# Patient Record
Sex: Male | Born: 1955 | Race: White | Hispanic: No | Marital: Married | Smoking: Former smoker
Health system: Southern US, Community
[De-identification: ages and names within clinical notes are randomized; demographics above are authoritative.]

## PROBLEM LIST (undated history)

## (undated) DIAGNOSIS — I10 Essential (primary) hypertension: Secondary | ICD-10-CM

## (undated) HISTORY — PX: CHOLECYSTECTOMY: SHX55

---

## 2014-10-12 ENCOUNTER — Emergency Department (INDEPENDENT_AMBULATORY_CARE_PROVIDER_SITE_OTHER)

## 2014-10-12 ENCOUNTER — Emergency Department (INDEPENDENT_AMBULATORY_CARE_PROVIDER_SITE_OTHER)
Admission: EM | Admit: 2014-10-12 | Discharge: 2014-10-12 | Disposition: A | Discharge status: Home / Self Care | Source: Home / Self Care | Attending: Family Medicine | Admitting: Family Medicine

## 2014-10-12 DIAGNOSIS — R05 Cough: Secondary | ICD-10-CM

## 2014-10-12 DIAGNOSIS — R0981 Nasal congestion: Secondary | ICD-10-CM

## 2014-10-12 DIAGNOSIS — R059 Cough, unspecified: Secondary | ICD-10-CM

## 2014-10-12 MED ORDER — AZITHROMYCIN 250 MG PO TABS: 250.0000 mg | ORAL_TABLET | Freq: Every day | ORAL | Status: DC

## 2014-10-12 MED ORDER — PREDNISONE 10 MG PO TABS: 30.0000 mg | ORAL_TABLET | Freq: Every day | ORAL | Status: DC

## 2014-10-12 MED ORDER — IPRATROPIUM-ALBUTEROL 0.5-2.5 (3) MG/3ML IN SOLN
3.0000 mL | Freq: Once | RESPIRATORY_TRACT | Status: AC
  Administered 2014-10-12: 3 mL via RESPIRATORY_TRACT

## 2014-10-12 MED ORDER — IPRATROPIUM BROMIDE 0.06 % NA SOLN: 2.0000 | Freq: Four times a day (QID) | NASAL | Status: DC

## 2014-10-12 NOTE — ED Notes (Signed)
Reports 5 day history of progressively worse congestion, cough, headache, wheezing, hoarseness and fatigue; denies fever. Robitussin this a.m.

## 2014-10-12 NOTE — ED Provider Notes (Signed)
Juan Rios is a 59 y.o. male who presents to Urgent Care today for nasal congestion cough and shortness of breath. Patient has a five-day history of sinus congestion and runny nose. The discharge was clear until yesterday when he became yellowish. The cough is productive of sputum. He is developed wheezing and shortness of breath also. No vomiting diarrhea or chest pain. He's tried multiple over-the-counter medications which have not helped much.   No past medical history on file. No past surgical history on file. History  Substance Use Topics  . Smoking status: Not on file  . Smokeless tobacco: Not on file  . Alcohol Use: Not on file   ROS as above Medications: No current facility-administered medications for this encounter.   Current Outpatient Prescriptions  Medication Sig Dispense Refill  . fluticasone (FLONASE) 50 MCG/ACT nasal spray Place into both nostrils daily.    . hydrochlorothiazide (HYDRODIURIL) 25 MG tablet Take 25 mg by mouth daily.    Marland Kitchen. azithromycin (ZITHROMAX) 250 MG tablet Take 1 tablet (250 mg total) by mouth daily. Take first 2 tablets together, then 1 every day until finished. 6 tablet 0  . ipratropium (ATROVENT) 0.06 % nasal spray Place 2 sprays into both nostrils 4 (four) times daily. 15 mL 1  . predniSONE (DELTASONE) 10 MG tablet Take 3 tablets (30 mg total) by mouth daily. 15 tablet 0   No Known Allergies   Exam:  BP 142/90 mmHg  Pulse 107  Temp(Src) 98.2 F (36.8 C) (Oral)  Resp 20  Ht 5\' 8"  (1.727 m)  Wt 300 lb (136.079 kg)  BMI 45.63 kg/m2  SpO2 94% Gen: Well NAD HEENT: EOMI,  MMM difficult to see posterior pharynx mildly erythematous. Normal tympanic membranes and right partially occluded by cerumen on left.  Lungs: Normal work of breathing.Slight coarse breath sounds right side Heart: RRR no MRG Abd: NABS, Soft. Nondistended, Nontender Exts: Brisk capillary refill, warm and well perfused.   Patient was given a 2.5/0.5 mg DuoNeb nebulizer  treatment, and  Felt a little better  No results found for this or any previous visit (from the past 24 hour(s)). Dg Chest 2 View  10/12/2014   CLINICAL DATA:  Two day history of cough  EXAM: CHEST  2 VIEW  COMPARISON:  None.  FINDINGS: There is no edema or consolidation. The heart size and pulmonary vascularity are normal. No adenopathy. No bone lesions. There is evidence of an old healed fracture of the left clavicle.  IMPRESSION: No edema or consolidation.   Electronically Signed   By: Bretta BangWilliam  Woodruff M.D.   On: 10/12/2014 13:53    Assessment and Plan: 59 y.o. male with  Sinusitis versus bronchitis. Treatment with prednisone and Atrovent nasal spray and azithromycin if not better. The x-ray took over 2 hours to do and get results back. Patient left prior to x-ray results back. I  Apologized for the delay.   Discussed warning signs or symptoms. Please see discharge instructions. Patient expresses understanding.     Rodolph BongEvan S Dashae Wilcher, MD 10/12/14 [phone]

## 2014-10-12 NOTE — Discharge Instructions (Signed)
Thank you for coming in today. I am sorry for the delay today.  I will call you if anything comes back.  Take the prednisone and nasal spray.  Take antibiotics if not improving.  Call or go to the emergency room if you get worse, have trouble breathing, have chest pains, or palpitations.   Sinusitis Sinusitis is redness, soreness, and inflammation of the paranasal sinuses. Paranasal sinuses are air pockets within the bones of your face (beneath the eyes, the middle of the forehead, or above the eyes). In healthy paranasal sinuses, mucus is able to drain out, and air is able to circulate through them by way of your nose. However, when your paranasal sinuses are inflamed, mucus and air can become trapped. This can allow bacteria and other germs to grow and cause infection. Sinusitis can develop quickly and last only a short time (acute) or continue over a long period (chronic). Sinusitis that lasts for more than 12 weeks is considered chronic.  CAUSES  Causes of sinusitis include:  Allergies.  Structural abnormalities, such as displacement of the cartilage that separates your nostrils (deviated septum), which can decrease the air flow through your nose and sinuses and affect sinus drainage.  Functional abnormalities, such as when the small hairs (cilia) that line your sinuses and help remove mucus do not work properly or are not present. SIGNS AND SYMPTOMS  Symptoms of acute and chronic sinusitis are the same. The primary symptoms are pain and pressure around the affected sinuses. Other symptoms include:  Upper toothache.  Earache.  Headache.  Bad breath.  Decreased sense of smell and taste.  A cough, which worsens when you are lying flat.  Fatigue.  Fever.  Thick drainage from your nose, which often is green and may contain pus (purulent).  Swelling and warmth over the affected sinuses. DIAGNOSIS  Your health care provider will perform a physical exam. During the exam, your  health care provider may:  Look in your nose for signs of abnormal growths in your nostrils (nasal polyps).  Tap over the affected sinus to check for signs of infection.  View the inside of your sinuses (endoscopy) using an imaging device that has a light attached (endoscope). If your health care provider suspects that you have chronic sinusitis, one or more of the following tests may be recommended:  Allergy tests.  Nasal culture. A sample of mucus is taken from your nose, sent to a lab, and screened for bacteria.  Nasal cytology. A sample of mucus is taken from your nose and examined by your health care provider to determine if your sinusitis is related to an allergy. TREATMENT  Most cases of acute sinusitis are related to a viral infection and will resolve on their own within 10 days. Sometimes medicines are prescribed to help relieve symptoms (pain medicine, decongestants, nasal steroid sprays, or saline sprays).  However, for sinusitis related to a bacterial infection, your health care provider will prescribe antibiotic medicines. These are medicines that will help kill the bacteria causing the infection.  Rarely, sinusitis is caused by a fungal infection. In theses cases, your health care provider will prescribe antifungal medicine. For some cases of chronic sinusitis, surgery is needed. Generally, these are cases in which sinusitis recurs more than 3 times per year, despite other treatments. HOME CARE INSTRUCTIONS   Drink plenty of water. Water helps thin the mucus so your sinuses can drain more easily.  Use a humidifier.  Inhale steam 3 to 4 times a day (  for example, sit in the bathroom with the shower running).  Apply a warm, moist washcloth to your face 3 to 4 times a day, or as directed by your health care provider.  Use saline nasal sprays to help moisten and clean your sinuses.  Take medicines only as directed by your health care provider.  If you were prescribed either  an antibiotic or antifungal medicine, finish it all even if you start to feel better. SEEK IMMEDIATE MEDICAL CARE IF:  You have increasing pain or severe headaches.  You have nausea, vomiting, or drowsiness.  You have swelling around your face.  You have vision problems.  You have a stiff neck.  You have difficulty breathing. MAKE SURE YOU:   Understand these instructions.  Will watch your condition.  Will get help right away if you are not doing well or get worse. Document Released: 09/19/2005 Document Revised: 02/03/2014 Document Reviewed: 10/04/2011 [facility] Patient Information 2015 [employer], [place]. This information is not intended to replace advice given to you by your health care provider. Make sure you discuss any questions you have with your health care provider.

## 2014-10-18 ENCOUNTER — Encounter: Payer: Self-pay | Admitting: Emergency Medicine

## 2015-07-26 IMAGING — CR DG CHEST 2V
2 series · 2 of 2 positions shown · non-contrast
Comparison: None.

CLINICAL DATA: Two day history of cough

EXAM:
CHEST  2 VIEW

[view not recorded (1 of 2)]
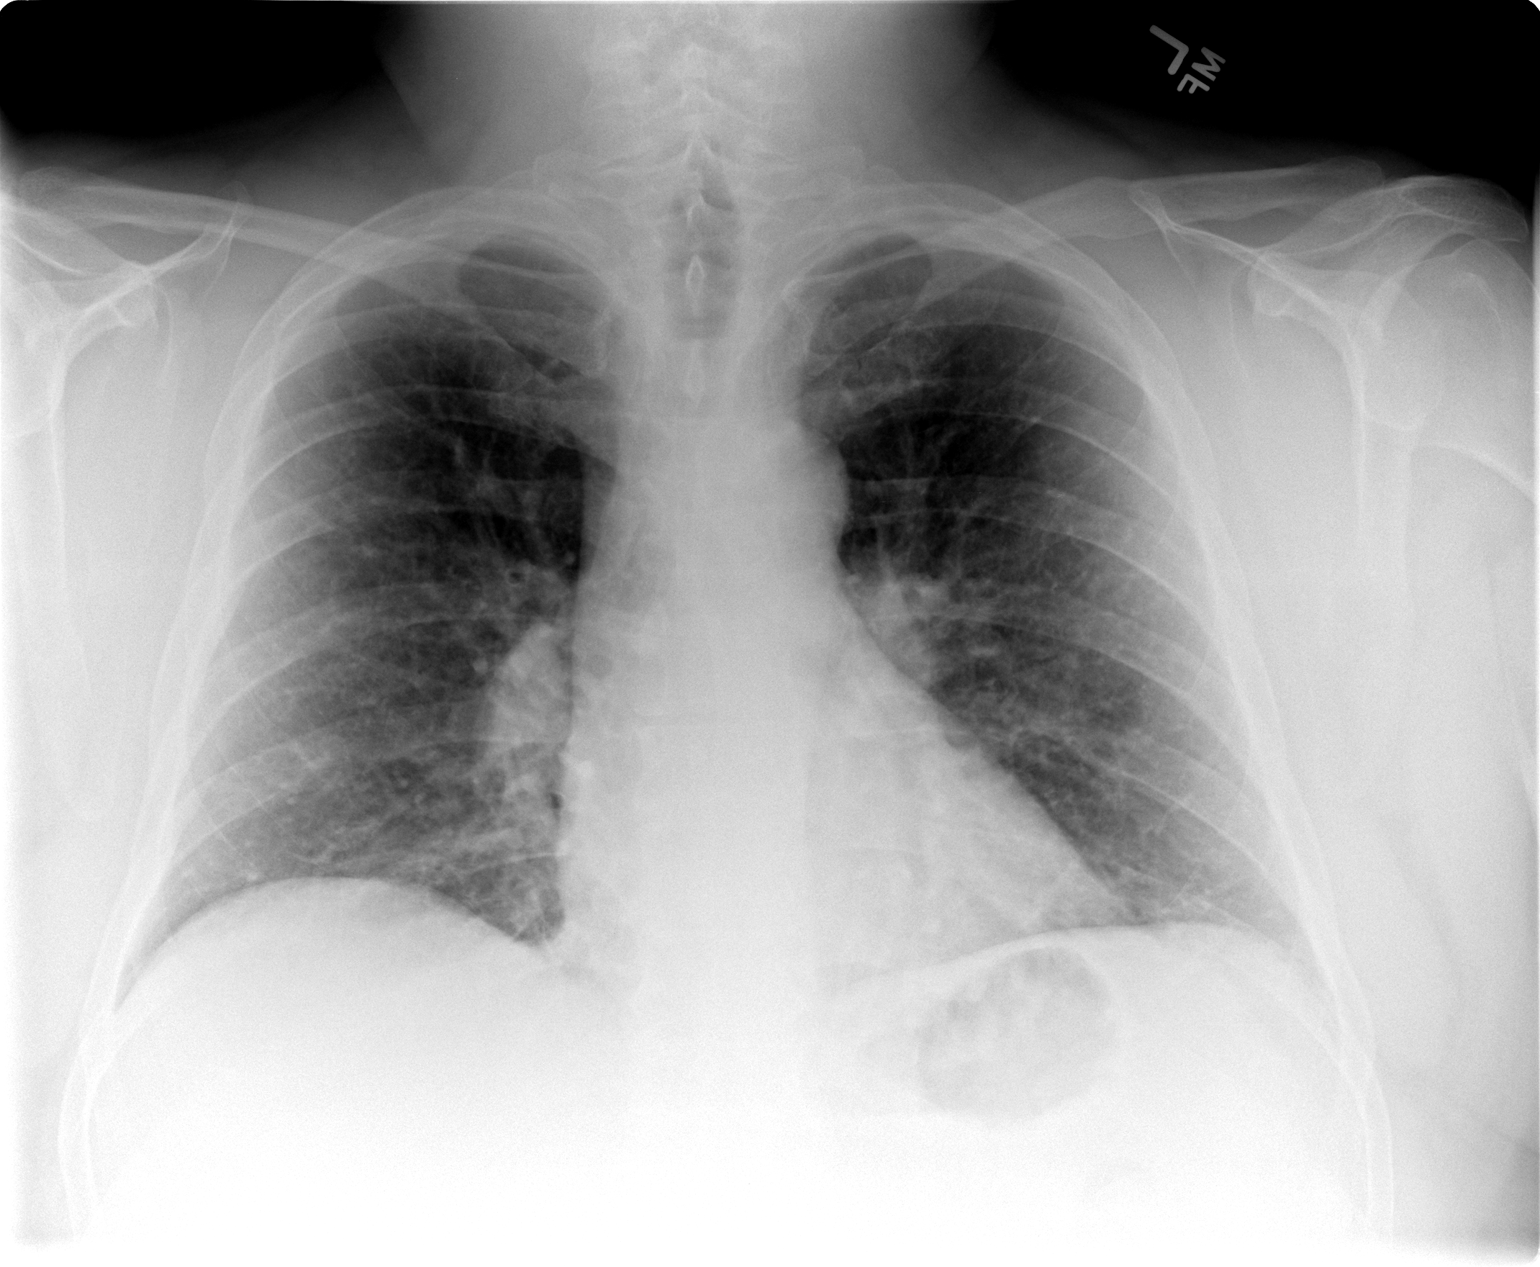

[view not recorded (2 of 2)]
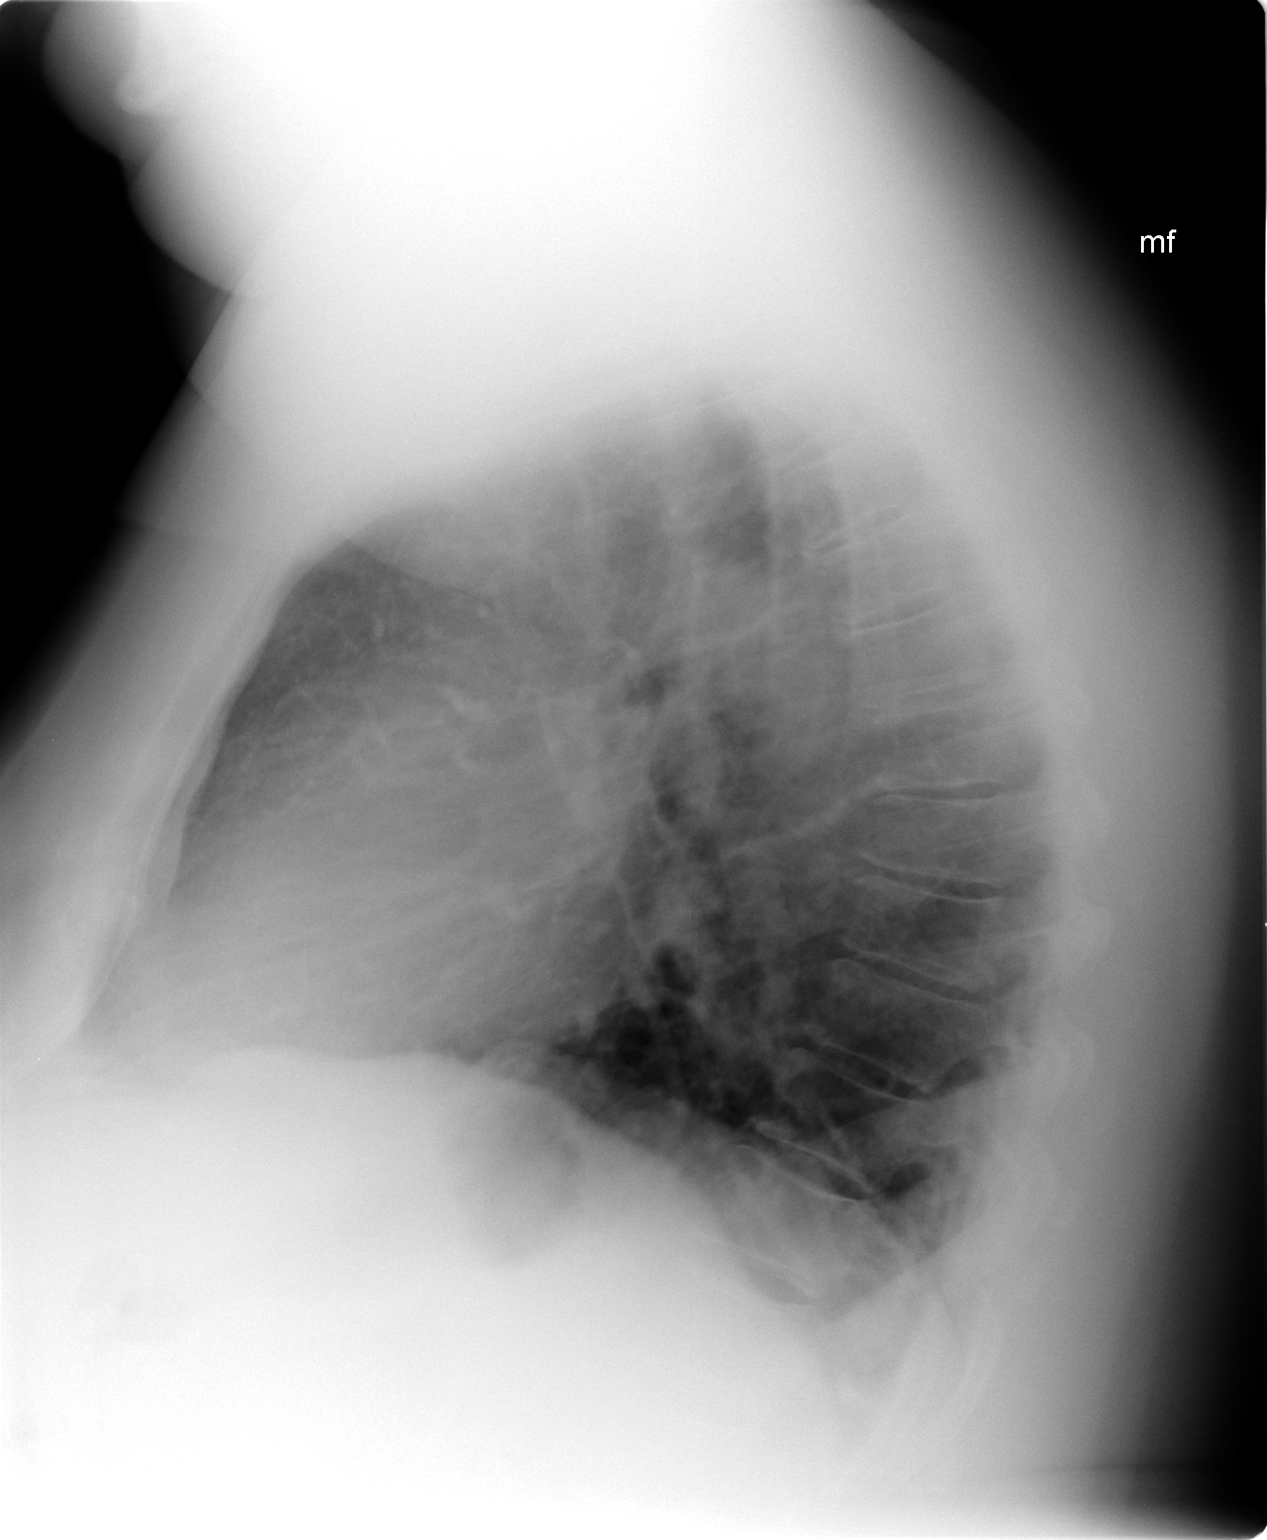

[2 of 2 positions shown; findings below may reference images not displayed]

FINDINGS: There is no edema or consolidation. The heart size and pulmonary
vascularity are normal. No adenopathy. No bone lesions. There is
evidence of an old healed fracture of the left clavicle.
IMPRESSION: No edema or consolidation.

## 2016-03-05 ENCOUNTER — Emergency Department (INDEPENDENT_AMBULATORY_CARE_PROVIDER_SITE_OTHER)

## 2016-03-05 ENCOUNTER — Encounter: Payer: Self-pay | Admitting: Emergency Medicine

## 2016-03-05 ENCOUNTER — Emergency Department (INDEPENDENT_AMBULATORY_CARE_PROVIDER_SITE_OTHER)
Admission: EM | Admit: 2016-03-05 | Discharge: 2016-03-05 | Disposition: A | Discharge status: Home / Self Care | Source: Home / Self Care | Attending: Family Medicine | Admitting: Family Medicine

## 2016-03-05 DIAGNOSIS — M79641 Pain in right hand: Secondary | ICD-10-CM

## 2016-03-05 DIAGNOSIS — S63501A Unspecified sprain of right wrist, initial encounter: Secondary | ICD-10-CM | POA: Diagnosis not present

## 2016-03-05 DIAGNOSIS — M25531 Pain in right wrist: Secondary | ICD-10-CM

## 2016-03-05 DIAGNOSIS — S6391XA Sprain of unspecified part of right wrist and hand, initial encounter: Secondary | ICD-10-CM | POA: Diagnosis not present

## 2016-03-05 HISTORY — DX: Essential (primary) hypertension: I10

## 2016-03-05 NOTE — ED Notes (Signed)
Pt fell on his right hand/wrist on yesterday.  Pt is having pain and swelling to the wrist.

## 2016-03-05 NOTE — Discharge Instructions (Signed)
You may alternate acetaminophen (Tylenol) and ibuprofen (Motrin or Advil) for pain and swelling. You should try to wear the wrist splint for about 1-2 weeks, and may remove while showering.  If pain continues, you may need repeat imaging or even a CT scan that can pick up on occult/hairline fractures that may be difficult to see at time of initial injury.  See below for further instructions.

## 2016-03-05 NOTE — ED Provider Notes (Signed)
CSN: [id number]     Arrival date & time 03/05/16  1033 History   First MD Initiated Contact with Patient 03/05/16 1048     Chief Complaint  Patient presents with  . Hand Injury   (Consider location/radiation/quality/duration/timing/severity/associated sxs/prior Treatment) HPI  The pt is a 60yo male [facility] with c/o Right hand and wrist pain after tripping and nearly falling yesterday, catching himself by putting out his Right hand to grab hold of a brick wall. Pain is aching and sore, only with movement, moderate in severity with movement, no pain at rest. Associated limited ROM and mild to moderate swelling. He has not taken anything for pain today.  Denies any other injuries. Hx of carpal tunnel in his Right wrist but has never had surgery on his wrist.   Past Medical History  Diagnosis Date  . Hypertension    Past Surgical History  Procedure Laterality Date  . Cholecystectomy     No family history on file. Social History  Substance Use Topics  . Smoking status: Never Smoker   . Smokeless tobacco: None  . Alcohol Use: None    Review of Systems  Musculoskeletal: Positive for myalgias, joint swelling and arthralgias.       Right hand and wrist  Skin: Negative for color change and wound.  Neurological: Positive for weakness (  right hand and wrist due to pain) and numbness ( mild intermittent- chronic per pt due to carpal tunnel syndrome).    Allergies  Review of patient's allergies indicates no known allergies.  Home Medications   Prior to Admission medications   Medication Sig Start Date End Date Taking? Authorizing Provider  hydrochlorothiazide (HYDRODIURIL) 25 MG tablet Take 25 mg by mouth daily.    Historical Provider, MD   Meds Ordered and Administered this Visit  Medications - No data to display  BP 135/89 mmHg  Pulse 79  Temp(Src) 98.2 F (36.8 C) (Oral)  Ht 5\' 7"  (1.702 m)  Wt 293 lb (132.904 kg)  BMI 45.88 kg/m2  SpO2 97% No data  found.   Physical Exam  Constitutional: He is oriented to person, place, and time. He appears well-developed and well-nourished.  HENT:  Head: Normocephalic and atraumatic.  Eyes: EOM are normal.  Neck: Normal range of motion.  Cardiovascular: Normal rate.   Right hand: cap refill < 3 seconds  Pulmonary/Chest: Effort normal.  Musculoskeletal: He exhibits edema and tenderness.  Right hand and wrist- mild to moderate edema, limited flexion and extension due to pain. 4/5 grip strength compared to Left. Full ROM all fingers. No snuff-box tenderness. Full ROM elbow, non-tender.  Neurological: He is alert and oriented to person, place, and time.  Right hand- normal sensation compared to Left hand.  Skin: Skin is warm and dry.  Right hand and wrist: skin in tact, no ecchymosis or erythema.  Psychiatric: He has a normal mood and affect. His behavior is normal.  Nursing note and vitals reviewed.   ED Course  Procedures (including critical care time)  Labs Review Labs Reviewed - No data to display  Imaging Review Dg Hand Complete Right  03/05/2016  CLINICAL DATA:  Fall yesterday, landing on right hand. Right hand pain and swelling. EXAM: RIGHT HAND - COMPLETE 3+ VIEW COMPARISON:  None. FINDINGS: No acute bony abnormality. Specifically, no fracture, subluxation, or dislocation. Soft tissues are intact. IMPRESSION: No acute bony abnormality. Electronically Signed   By: Charlett NoseKevin  Dover M.D.   On: 03/05/2016 11:31  MDM   1. Hand sprain, right, initial encounter   2. Right hand pain   3. Right wrist pain   4. Right wrist sprain, initial encounter    Pt c/o Right hand and wrist pain. Limited ROM. Sensation and circulation in tact. Skin in tact. No other injuries.  Plain films reviewed, location of wrist pain visible on plain films of hand, no evidence of fracture or dislocation. Reviewed imaging with pt. Will treat as sprain with thumb spica splint. Encouraged elevation, ice, may  alternate acetaminophen and ibuprofen. F/u with Sports Medicine in 1-2 weeks if not improving, may need repeat imaging for possible occult fracture. Patient verbalized understanding and agreement with treatment plan.     Junius Finner, PA-C 03/05/16 1158

## 2016-12-17 IMAGING — DX DG HAND COMPLETE 3+V*R*
3 series · 3 of 3 positions shown · non-contrast
Comparison: None.

CLINICAL DATA: Fall yesterday, landing on right hand. Right hand
pain and swelling.

EXAM:
RIGHT HAND - COMPLETE 3+ VIEW

[hand pa]
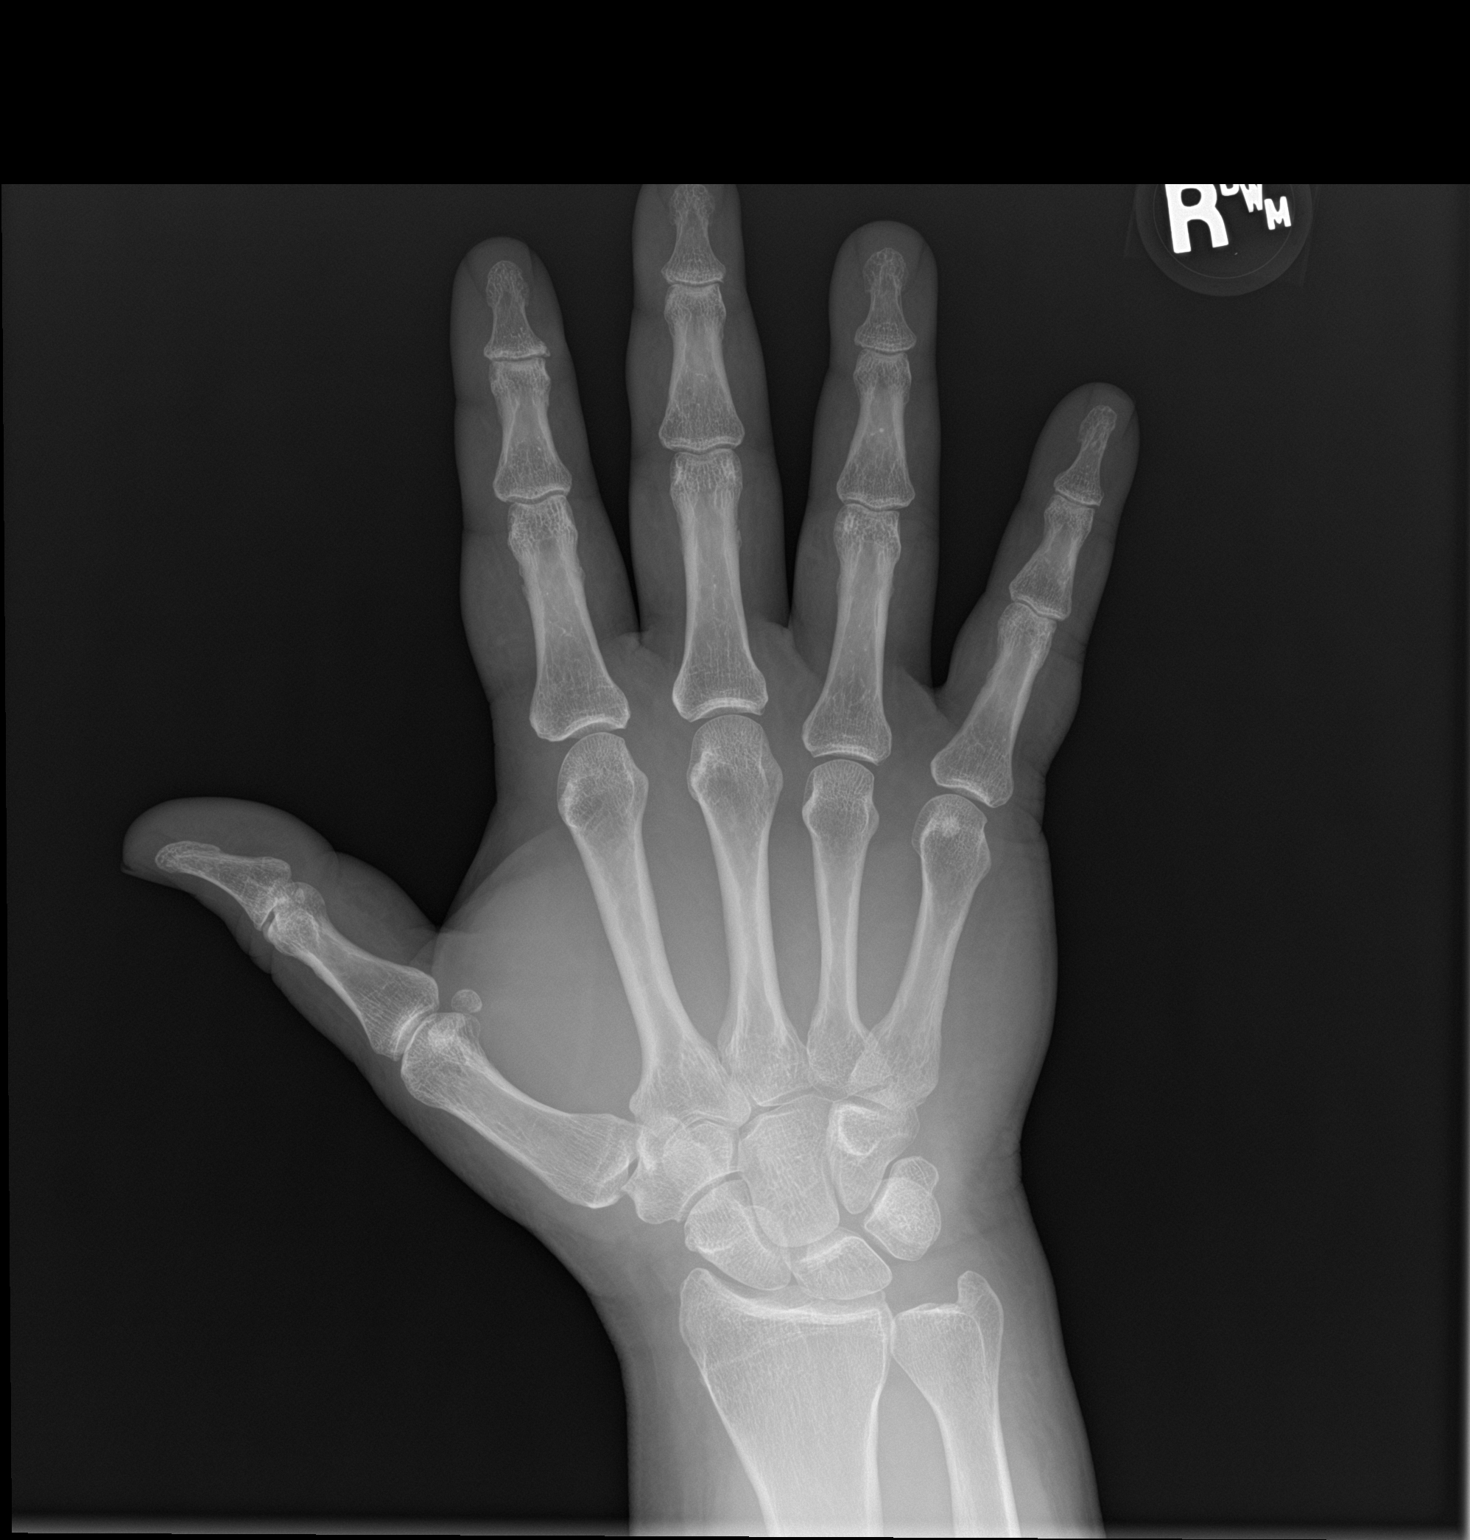

[hand obl]
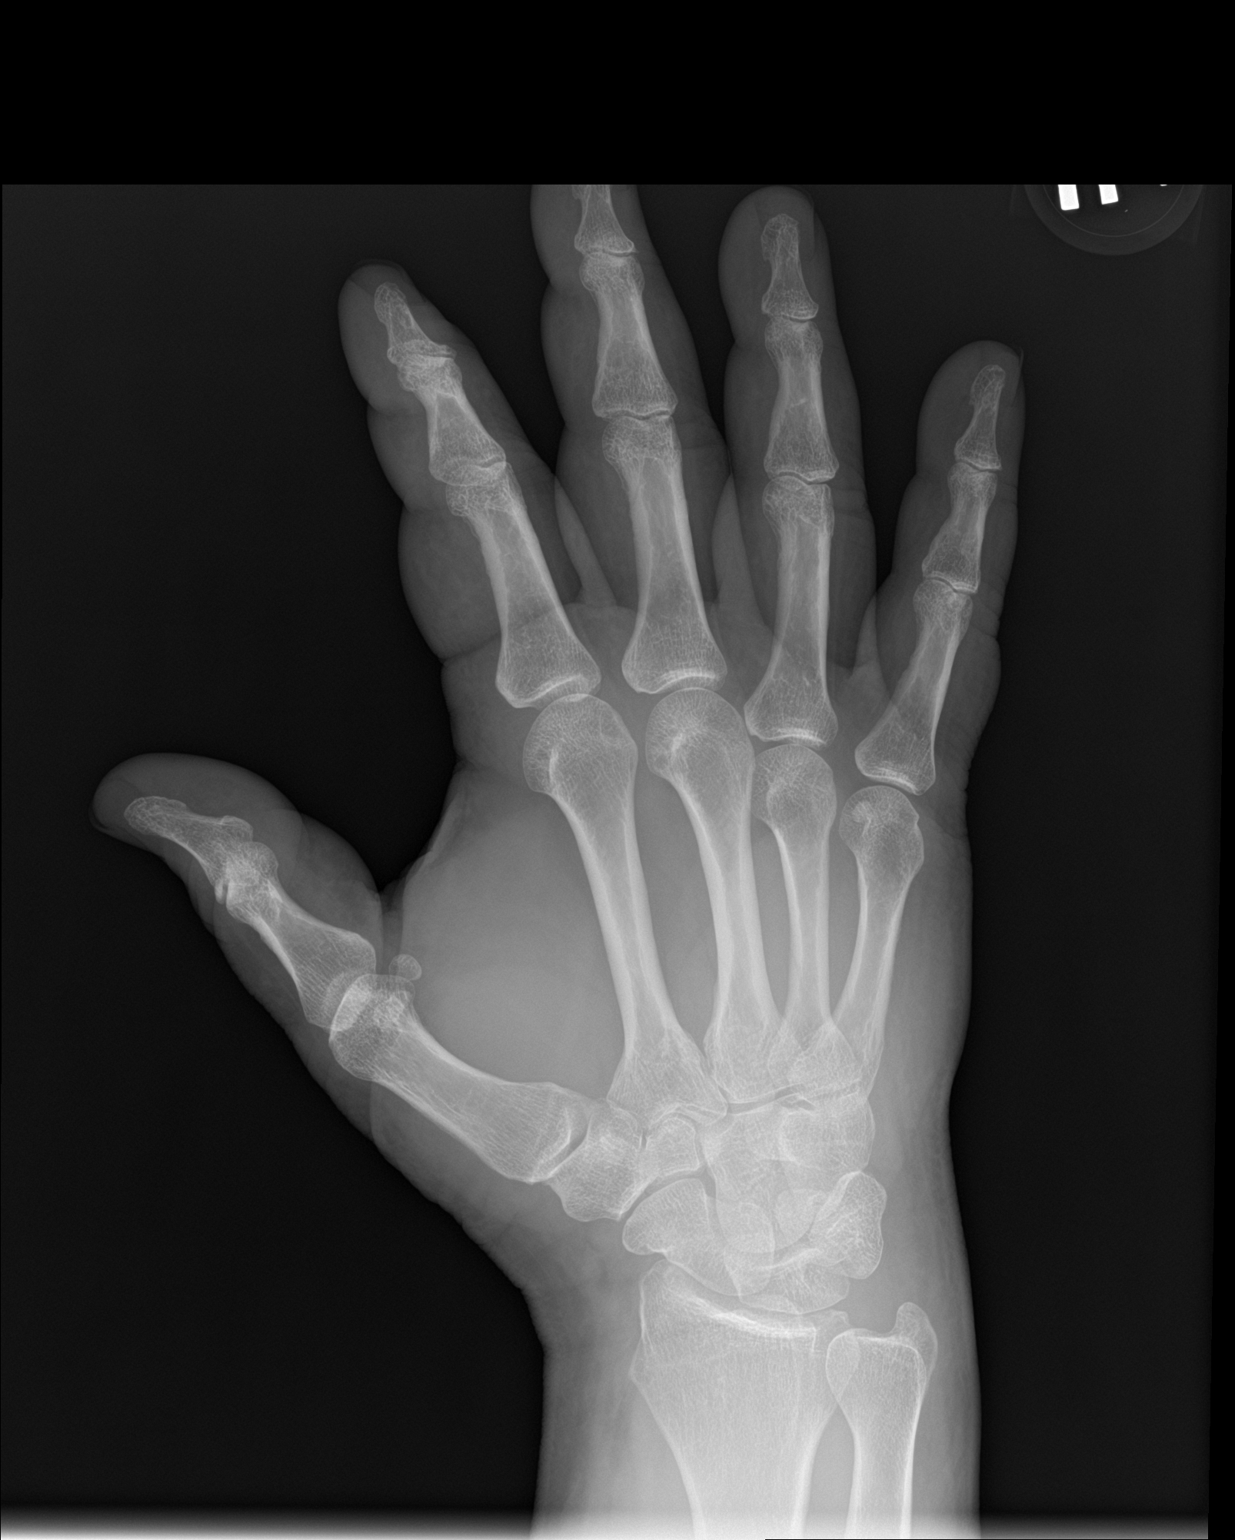

[hand lat]
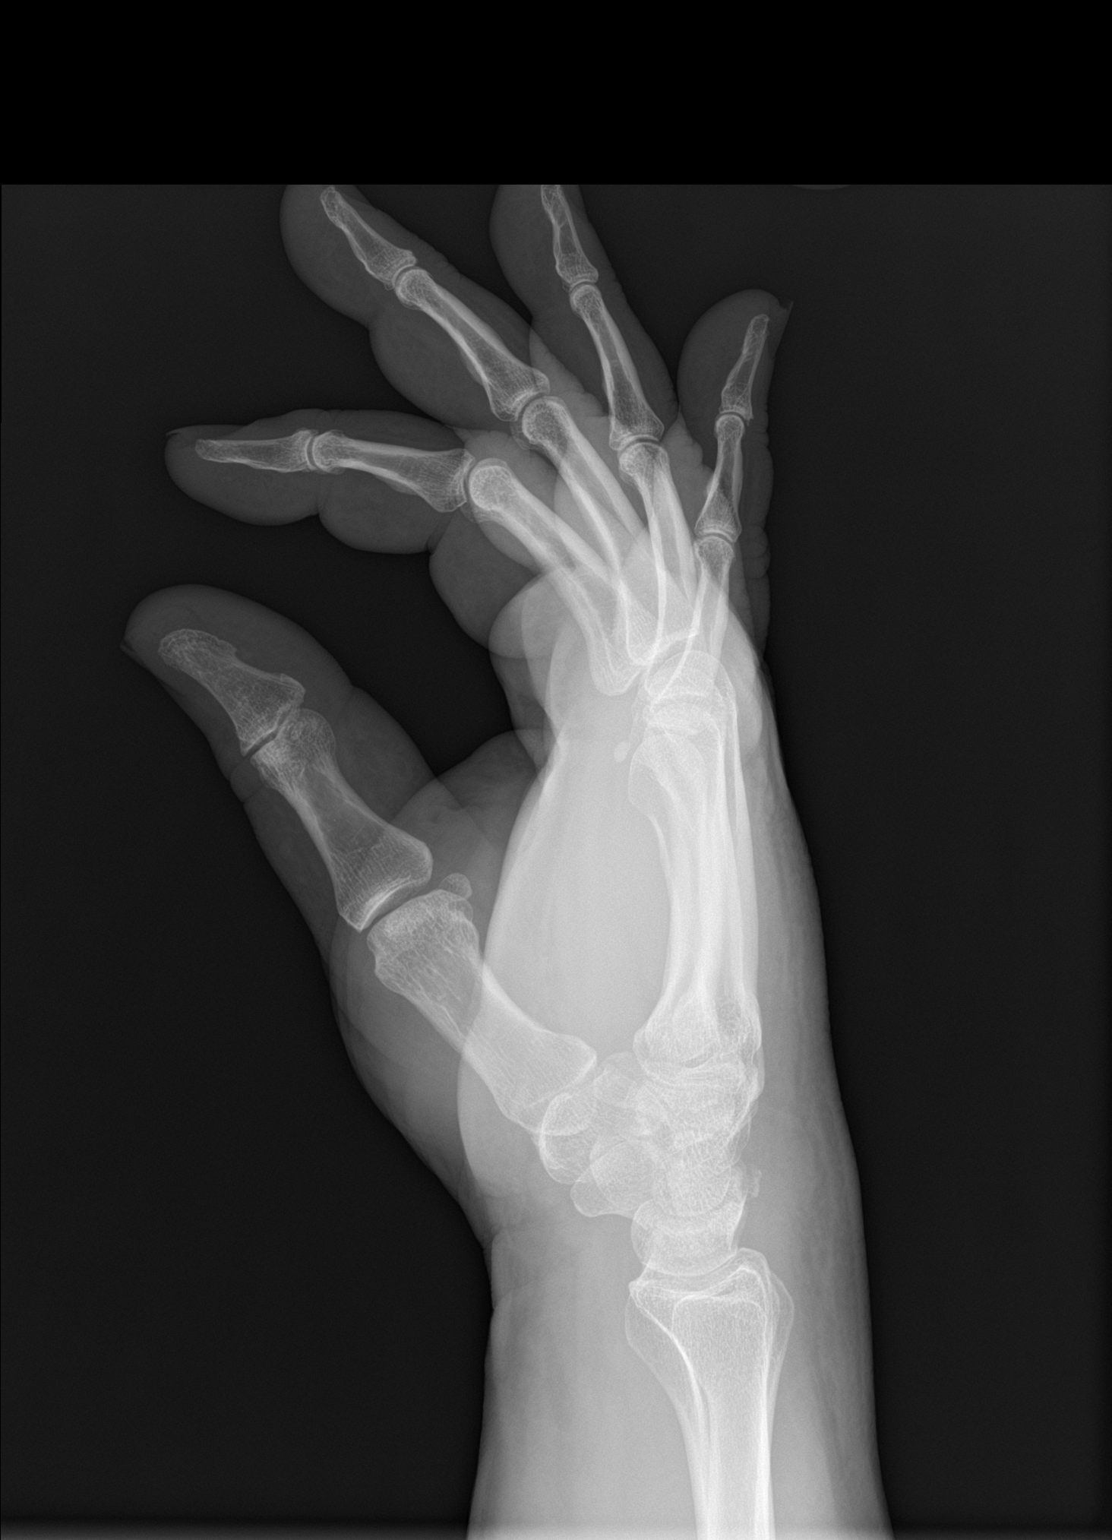

[3 of 3 positions shown; findings below may reference images not displayed]

FINDINGS: No acute bony abnormality. Specifically, no fracture, subluxation,
or dislocation. Soft tissues are intact.
IMPRESSION: No acute bony abnormality.

## 2018-11-03 ENCOUNTER — Encounter: Payer: Self-pay | Admitting: Emergency Medicine

## 2018-11-03 ENCOUNTER — Other Ambulatory Visit: Payer: Self-pay

## 2018-11-03 ENCOUNTER — Emergency Department (INDEPENDENT_AMBULATORY_CARE_PROVIDER_SITE_OTHER)
Admission: EM | Admit: 2018-11-03 | Discharge: 2018-11-03 | Disposition: A | Discharge status: Home / Self Care | Source: Home / Self Care

## 2018-11-03 DIAGNOSIS — J40 Bronchitis, not specified as acute or chronic: Secondary | ICD-10-CM | POA: Diagnosis not present

## 2018-11-03 MED ORDER — AZITHROMYCIN 250 MG PO TABS: ORAL_TABLET | ORAL | 0 refills | Status: DC

## 2018-11-03 MED ORDER — HYDROCODONE-HOMATROPINE 5-1.5 MG/5ML PO SYRP: 5.0000 mL | ORAL_SOLUTION | Freq: Four times a day (QID) | ORAL | 0 refills | Status: DC | PRN

## 2018-11-03 MED ORDER — PREDNISONE 20 MG PO TABS: ORAL_TABLET | ORAL | 1 refills | Status: DC

## 2018-11-03 NOTE — ED Provider Notes (Signed)
[facility]    CSN: [id number] Arrival date & time: 11/03/18  1153     History   Chief Complaint Chief Complaint  Patient presents with  . Cough  . Fever  . Nasal Congestion    HPI Juan Rios is a 63 y.o. male.   63 year old established patient [facility] with cough for 4 days.  Patient started feeling ill 4 days ago; now has cough, congestion and low grade fever; took advil 0930.  He had a physical checkup on Monday and felt fine.  By Wednesday he was starting to cough and feeling bad with congestion.  Patient drives a truck.  He goes as far as [place].     Past Medical History:  Diagnosis Date  . Hypertension     There are no active problems to display for this patient.   Past Surgical History:  Procedure Laterality Date  . CHOLECYSTECTOMY         Home Medications    Prior to Admission medications   Medication Sig Start Date End Date Taking? Authorizing Provider  azelastine (ASTELIN) 0.1 % nasal spray Place into both nostrils 2 (two) times daily. Use in each nostril as directed   Yes [provider]  azithromycin (ZITHROMAX) 250 MG tablet Take 2 tabs PO x 1 dose, then 1 tab PO QD x 4 days 11/03/18   Elvina Sidle, MD  hydrochlorothiazide (HYDRODIURIL) 25 MG tablet Take 25 mg by mouth daily.    [provider]  HYDROcodone-homatropine (HYDROMET) 5-1.5 MG/5ML syrup Take 5 mLs by mouth every 6 (six) hours as needed for cough. 11/03/18   Elvina Sidle, MD  predniSONE (DELTASONE) 20 MG tablet 2 daily with food 11/03/18   Elvina Sidle, MD    Family History No family history on file.  Social History Social History   Tobacco Use  . Smoking status: Former Games developer  . Smokeless tobacco: Never Used  Substance Use Topics  . Alcohol use: Not Currently  . Drug use: Not on file     Allergies   Patient has no known allergies.   Review of Systems Review of Systems   Physical Exam Triage Vital Signs ED  Triage Vitals  Enc Vitals Group     BP      Pulse      Resp      Temp      Temp src      SpO2      Weight      Height      Head Circumference      Peak Flow      Pain Score      Pain Loc      Pain Edu?      Excl. in GC?    No data found.  Updated Vital Signs BP 123/81 (BP Location: Right Arm)   Pulse 92   Temp 98 F (36.7 C) (Oral)   Resp 18   Ht 5\' 8"  (1.727 m)   Wt 136.1 kg   SpO2 94%   BMI 45.61 kg/m    Physical Exam Vitals signs and nursing note reviewed.  Constitutional:      Appearance: Normal appearance. He is obese.  HENT:     Head: Normocephalic.     Right Ear: Tympanic membrane and external ear normal.     Left Ear: Tympanic membrane and external ear normal.     Nose: Congestion present.     Mouth/Throat:     Mouth: Mucous  membranes are moist.  Eyes:     Conjunctiva/sclera: Conjunctivae normal.  Neck:     Musculoskeletal: Normal range of motion and neck supple.  Cardiovascular:     Rate and Rhythm: Normal rate and regular rhythm.     Heart sounds: Normal heart sounds.  Pulmonary:     Effort: Pulmonary effort is normal.     Breath sounds: Wheezing and rhonchi present.  Musculoskeletal: Normal range of motion.  Skin:    General: Skin is warm and dry.  Neurological:     General: No focal deficit present.     Mental Status: He is alert and oriented to person, place, and time.  Psychiatric:        Mood and Affect: Mood normal.      UC Treatments / Results  Labs (all labs ordered are listed, but only abnormal results are displayed) Labs Reviewed - No data to display  EKG None  Radiology No results found.  Procedures Procedures (including critical care time)  Medications Ordered in UC Medications - No data to display  Initial Impression / Assessment and Plan / UC Course  I have reviewed the triage vital signs and the nursing notes.  Pertinent labs & imaging results that were available during my care of the patient were reviewed  by me and considered in my medical decision making (see chart for details).    Final Clinical Impressions(s) / UC Diagnoses   Final diagnoses:  Bronchitis   Discharge Instructions   None    ED Prescriptions    Medication Sig Dispense Auth. Provider   azithromycin (ZITHROMAX) 250 MG tablet Take 2 tabs PO x 1 dose, then 1 tab PO QD x 4 days 6 tablet Elvina SidleLauenstein, Deondrae Mcgrail, MD   predniSONE (DELTASONE) 20 MG tablet 2 daily with food 10 tablet Elvina SidleLauenstein, Rory Montel, MD   HYDROcodone-homatropine (HYDROMET) 5-1.5 MG/5ML syrup Take 5 mLs by mouth every 6 (six) hours as needed for cough. 60 mL Elvina SidleLauenstein, Cory Rama, MD     Controlled Substance Prescriptions [place] Controlled Substance Registry consulted? Not Applicable   Elvina SidleLauenstein, Jurline Folger, MD 11/03/18 1313

## 2018-11-03 NOTE — ED Triage Notes (Signed)
Patient started feeling ill 4 days ago; now has cough, congestion and low grade fever; took advil 0930.

## 2024-07-19 ENCOUNTER — Ambulatory Visit: Admission: RE | Admit: 2024-07-19 | Discharge: 2024-07-19 | Disposition: A | Discharge status: Home / Self Care

## 2024-07-19 VITALS — BP 157/90 | HR 82 | Temp 97.9°F | Resp 20

## 2024-07-19 DIAGNOSIS — H6691 Otitis media, unspecified, right ear: Secondary | ICD-10-CM

## 2024-07-19 MED ORDER — AMOXICILLIN-POT CLAVULANATE 875-125 MG PO TABS: 1.0000 | ORAL_TABLET | Freq: Two times a day (BID) | ORAL | 0 refills | Status: AC

## 2024-07-19 NOTE — ED Provider Notes (Signed)
 [facility]    CSN: [id number] Arrival date & time: 07/19/24  1342      History   Chief Complaint Chief Complaint  Patient presents with   Ear Injury    Ear Infection? - Entered by patient    HPI Juan Rios is a 68 y.o. male.   HPI pleasant 68 year old male presents with possible ear infection.  Patient reports right ear pain for 5 days. Additionally, patient reports had 2 teeth extracted from bottom right on 07/08/2024 which has become bothersome as well.  PMH significant for morbid obesity and HTN.  Past Medical History:  Diagnosis Date   Hypertension     There are no active problems to display for this patient.   Past Surgical History:  Procedure Laterality Date   CHOLECYSTECTOMY         Home Medications    Prior to Admission medications   Medication Sig Start Date End Date Taking? Authorizing Provider  amoxicillin-clavulanate (AUGMENTIN) 875-125 MG tablet Take 1 tablet by mouth 2 (two) times daily for 10 days. 07/19/24 07/29/24 Yes Juan Sharper, FNP  Aspirin 81 MG CAPS aspirin 81 mg capsule    Status: Active  Code System: RxNorm    [provider]  azelastine (ASTELIN) 0.1 % nasal spray Place into both nostrils 2 (two) times daily. Use in each nostril as directed    [provider]  hydrochlorothiazide (HYDRODIURIL) 25 MG tablet Take 25 mg by mouth daily.    [provider]    Family History History reviewed. No pertinent family history.  Social History Social History   Tobacco Use   Smoking status: Former   Smokeless tobacco: Never  Substance Use Topics   Alcohol use: Not Currently     Allergies   Patient has no known allergies.   Review of Systems Review of Systems   Physical Exam Triage Vital Signs ED Triage Vitals  Encounter Vitals Group     BP      Girls Systolic BP Percentile      Girls Diastolic BP Percentile      Boys Systolic BP Percentile      Boys Diastolic BP Percentile       Pulse      Resp      Temp      Temp src      SpO2      Weight      Height      Head Circumference      Peak Flow      Pain Score      Pain Loc      Pain Education      Exclude from Growth Chart    No data found.  Updated Vital Signs BP (!) 157/90   Pulse 82   Temp 97.9 F (36.6 C)   Resp 20   SpO2 96%    Physical Exam Vitals and nursing note reviewed.  Constitutional:      General: He is not in acute distress.    Appearance: Normal appearance. He is obese. He is not ill-appearing.  HENT:     Head: Normocephalic.     Right Ear: Ear canal and external ear normal.     Left Ear: Tympanic membrane, ear canal and external ear normal.     Ears:     Comments: Right TM: Erythematous, bulging    Mouth/Throat:     Mouth: Mucous membranes are moist.     Pharynx: Oropharynx is clear.  Comments: Lower mandibular (right sided): #32 (third molar) with top half missing, mildly erythematous gingival lining noted Eyes:     Extraocular Movements: Extraocular movements intact.     Pupils: Pupils are equal, round, and reactive to light.  Cardiovascular:     Rate and Rhythm: Normal rate and regular rhythm.     Pulses: Normal pulses.     Heart sounds: Normal heart sounds.  Pulmonary:     Effort: Pulmonary effort is normal.     Breath sounds: Normal breath sounds. No wheezing, rhonchi or rales.  Musculoskeletal:        General: Normal range of motion.  Skin:    General: Skin is warm and dry.  Neurological:     General: No focal deficit present.     Mental Status: He is alert and oriented to person, place, and time.  Psychiatric:        Mood and Affect: Mood normal.        Behavior: Behavior normal.      UC Treatments / Results  Labs (all labs ordered are listed, but only abnormal results are displayed) Labs Reviewed - No data to display  EKG   Radiology No results found.  Procedures Procedures (including critical care time)  Medications Ordered in  UC Medications - No data to display  Initial Impression / Assessment and Plan / UC Course  I have reviewed the triage vital signs and the nursing notes.  Pertinent labs & imaging results that were available during my care of the patient were reviewed by me and considered in my medical decision making (see chart for details).     MDM: 1.  Acute right otitis media-Rx'd Augmentin 875/125 mg tablet: Take 1 tablet twice daily x 10 days. Advised patient take medication as directed with food to completion.  Encouraged increase daily water intake to 64 ounces per day while taking this medication.  Advised if symptoms worsen and/or unresolved please follow-up with your PCP, ENT, or here for further evaluation. Final Clinical Impressions(s) / UC Diagnoses   Final diagnoses:  Acute right otitis media     Discharge Instructions      Advised patient take medication as directed with food to completion.  Encouraged increase daily water intake to 64 ounces per day while taking this medication.  Advised if symptoms worsen and/or unresolved please follow-up with your PCP, ENT, or here for further evaluation.     ED Prescriptions     Medication Sig Dispense Auth. Provider   amoxicillin-clavulanate (AUGMENTIN) 875-125 MG tablet Take 1 tablet by mouth 2 (two) times daily for 10 days. 20 tablet Juan Mignogna, FNP      PDMP not reviewed this encounter.   Juan Sharper, FNP 07/19/24 1510

## 2024-07-19 NOTE — Discharge Instructions (Addendum)
 Advised patient take medication as directed with food to completion.  Encouraged increase daily water intake to 64 ounces per day while taking this medication.  Advised if symptoms worsen and/or unresolved please follow-up with your PCP, ENT, or here for further evaluation.

## 2024-07-19 NOTE — ED Triage Notes (Signed)
 Pt presents to [facility] with co right otalgia on Sunday.  Reports 10/6 he had two teeth removed on the right bottome side for which he completed amoxil for last dose on Sunday.   Pt has been using otc motrin and tyelnol. Denies any fevers.
